# Patient Record
Sex: Female | Born: 1965 | Race: Black or African American | Hispanic: No | Marital: Married | Smoking: Never smoker
Health system: Southern US, Community
[De-identification: ages and names within clinical notes are randomized; demographics above are authoritative.]

## PROBLEM LIST (undated history)

## (undated) DIAGNOSIS — C801 Malignant (primary) neoplasm, unspecified: Secondary | ICD-10-CM

## (undated) DIAGNOSIS — I10 Essential (primary) hypertension: Secondary | ICD-10-CM

## (undated) HISTORY — PX: ABDOMINAL HYSTERECTOMY: SHX81

## (undated) HISTORY — PX: CHOLECYSTECTOMY: SHX55

---

## 2005-07-24 DIAGNOSIS — C801 Malignant (primary) neoplasm, unspecified: Secondary | ICD-10-CM

## 2005-07-24 HISTORY — PX: BREAST LUMPECTOMY: SHX2

## 2005-07-24 HISTORY — DX: Malignant (primary) neoplasm, unspecified: C80.1

## 2006-05-14 ENCOUNTER — Emergency Department (HOSPITAL_COMMUNITY): Admission: EM | Admit: 2006-05-14 | Discharge: 2006-05-14 | Payer: Self-pay | Admitting: Emergency Medicine

## 2006-11-19 ENCOUNTER — Emergency Department (HOSPITAL_COMMUNITY): Admission: EM | Admit: 2006-11-19 | Discharge: 2006-11-20 | Payer: Self-pay | Admitting: Emergency Medicine

## 2007-01-01 ENCOUNTER — Ambulatory Visit: Admission: RE | Admit: 2007-01-01 | Discharge: 2007-03-05 | Payer: Self-pay | Admitting: Radiation Oncology

## 2007-02-15 ENCOUNTER — Ambulatory Visit (HOSPITAL_COMMUNITY): Admission: RE | Admit: 2007-02-15 | Discharge: 2007-02-15 | Payer: Self-pay | Admitting: Obstetrics & Gynecology

## 2010-12-06 NOTE — Op Note (Signed)
Pamela Pitts, Pamela Pitts                ACCOUNT NO.:  [id number]   MEDICAL RECORD NO.:  [id number]          PATIENT TYPE:  AMB   LOCATION:  SDC                           FACILITY:  [facility]   PHYSICIAN:  Roseanna Rainbow, M.D.DATE OF BIRTH:  1966/04/29   DATE OF PROCEDURE:  02/15/2007  DATE OF DISCHARGE:                               OPERATIVE REPORT   PREOPERATIVE DIAGNOSIS:  Multiparity, desires sterilization procedure.   POSTOPERATIVE DIAGNOSIS:  Multiparity, desires sterilization procedure.   PROCEDURE:  Laparoscopic bilateral tubal ligation with fulguration.   SURGEON:  Roseanna Rainbow, M.D.   ANESTHESIA:  General endotracheal anesthesia.   ESTIMATED BLOOD LOSS:  Minimal.   COMPLICATIONS:  None.   DESCRIPTION OF PROCEDURE:  The patient was taken to the operating room  with an IV running.  She was given general anesthesia and placed in the  dorsal lithotomy position.  A bivalve speculum was placed in the  patient's vagina.  The anterior lip of the cervix was grasped with a  single tooth tenaculum.  A Hulka manipulator was then advanced into the  uterus and secured to the anterior lip of the cervix as a means of  manipulating the uterus.  The single tooth tenaculum and speculum were  then removed.  The abdomen and vagina was then prepped and draped in the  usual sterile fashion.  A time out was then performed.   An infraumbilical skin incision was made with the scalpel.  The Veress  needle was then introduced into the abdomen while tenting up the  anterior abdominal wall at a 45 degrees angle.  A saline drop test was  used to confirm intra-abdominal placement.  There was an appropriate low  pressure reading upon initial insufflation of the abdomen with CO2 gas.  The abdomen was then insufflated.  The Veress needle was removed.  The  trocar and sleeve were then advanced into the abdomen where intra-  abdominal placement was confirmed with the laparoscope.  A survey of  the  pelvic anatomy revealed normal pelvic viscera.  The right fallopian tube  was then followed out to the fimbriated end.  A 2 cm segment of the mid  isthmic portion of the tube was then contiguously cauterized using the  Kleppinger instrument.  With each application, the ohmmeter was noted to  go to 0.  The left fallopian tube was manipulated in a similar fashion.  All instruments were removed from the abdomen.  The skin was closed  using interrupted sutures of  3-0 Vicryl and Dermabond.  The Hulka manipulator was removed from the  vagina with minimal bleeding noted from the cervix.  At the close of the  procedure, the instrument and pack counts were said to be correct x2.  The patient was taken to the PACU awake and in stable condition.      Roseanna Rainbow, M.D.  Electronically Signed     LAJ/MEDQ  D:  02/15/2007  T:  02/16/2007  Job:  [id number]

## 2011-05-08 LAB — CBC
HCT: 36.8
Hemoglobin: 12
MCHC: 32.6
RDW: 15.9 — ABNORMAL HIGH

## 2017-11-24 ENCOUNTER — Emergency Department (HOSPITAL_COMMUNITY)

## 2017-11-24 ENCOUNTER — Other Ambulatory Visit: Payer: Self-pay

## 2017-11-24 ENCOUNTER — Encounter (HOSPITAL_COMMUNITY): Payer: Self-pay

## 2017-11-24 ENCOUNTER — Emergency Department (HOSPITAL_COMMUNITY)
Admission: EM | Admit: 2017-11-24 | Discharge: 2017-11-24 | Disposition: A | Discharge status: Home / Self Care | Attending: Emergency Medicine | Admitting: Emergency Medicine

## 2017-11-24 DIAGNOSIS — Y92410 Unspecified street and highway as the place of occurrence of the external cause: Secondary | ICD-10-CM | POA: Diagnosis not present

## 2017-11-24 DIAGNOSIS — I10 Essential (primary) hypertension: Secondary | ICD-10-CM | POA: Diagnosis not present

## 2017-11-24 DIAGNOSIS — Y999 Unspecified external cause status: Secondary | ICD-10-CM | POA: Insufficient documentation

## 2017-11-24 DIAGNOSIS — K649 Unspecified hemorrhoids: Secondary | ICD-10-CM | POA: Diagnosis not present

## 2017-11-24 DIAGNOSIS — S161XXA Strain of muscle, fascia and tendon at neck level, initial encounter: Secondary | ICD-10-CM | POA: Diagnosis not present

## 2017-11-24 DIAGNOSIS — Y9389 Activity, other specified: Secondary | ICD-10-CM | POA: Diagnosis not present

## 2017-11-24 HISTORY — DX: Essential (primary) hypertension: I10

## 2017-11-24 HISTORY — DX: Malignant (primary) neoplasm, unspecified: C80.1

## 2017-11-24 LAB — COMPREHENSIVE METABOLIC PANEL
ALT: 19 U/L (ref 14–54)
AST: 20 U/L (ref 15–41)
Albumin: 4 g/dL (ref 3.5–5.0)
Alkaline Phosphatase: 68 U/L (ref 38–126)
Anion gap: 8 (ref 5–15)
BUN: 8 mg/dL (ref 6–20)
CO2: 28 mmol/L (ref 22–32)
Calcium: 9.1 mg/dL (ref 8.9–10.3)
Chloride: 108 mmol/L (ref 101–111)
Creatinine, Ser: 0.86 mg/dL (ref 0.44–1.00)
Glucose, Bld: 116 mg/dL — ABNORMAL HIGH (ref 65–99)
POTASSIUM: 3.9 mmol/L (ref 3.5–5.1)
SODIUM: 144 mmol/L (ref 135–145)
Total Bilirubin: 0.4 mg/dL (ref 0.3–1.2)
Total Protein: 7.3 g/dL (ref 6.5–8.1)

## 2017-11-24 LAB — CBC
HEMATOCRIT: 35.1 % — AB (ref 36.0–46.0)
HEMOGLOBIN: 11.2 g/dL — AB (ref 12.0–15.0)
MCH: 24.5 pg — AB (ref 26.0–34.0)
MCHC: 31.9 g/dL (ref 30.0–36.0)
MCV: 76.6 fL — AB (ref 78.0–100.0)
Platelets: 332 10*3/uL (ref 150–400)
RBC: 4.58 MIL/uL (ref 3.87–5.11)
RDW: 16.1 % — ABNORMAL HIGH (ref 11.5–15.5)
WBC: 5.5 10*3/uL (ref 4.0–10.5)

## 2017-11-24 LAB — POC OCCULT BLOOD, ED: Fecal Occult Bld: NEGATIVE

## 2017-11-24 MED ORDER — IBUPROFEN 600 MG PO TABS: 600.0000 mg | ORAL_TABLET | Freq: Four times a day (QID) | ORAL | 0 refills | Status: DC | PRN

## 2017-11-24 MED ORDER — CYCLOBENZAPRINE HCL 10 MG PO TABS: 10.0000 mg | ORAL_TABLET | Freq: Two times a day (BID) | ORAL | 0 refills | Status: DC | PRN
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
## 2020-09-12 ENCOUNTER — Other Ambulatory Visit: Payer: Self-pay

## 2020-09-12 ENCOUNTER — Emergency Department (HOSPITAL_COMMUNITY)
Admission: EM | Admit: 2020-09-12 | Discharge: 2020-09-12 | Disposition: A | Discharge status: Home / Self Care | Attending: Emergency Medicine | Admitting: Emergency Medicine

## 2020-09-12 ENCOUNTER — Encounter (HOSPITAL_COMMUNITY): Payer: Self-pay | Admitting: *Deleted

## 2020-09-12 DIAGNOSIS — Z859 Personal history of malignant neoplasm, unspecified: Secondary | ICD-10-CM | POA: Insufficient documentation

## 2020-09-12 DIAGNOSIS — Z79899 Other long term (current) drug therapy: Secondary | ICD-10-CM | POA: Diagnosis not present

## 2020-09-12 DIAGNOSIS — T7840XA Allergy, unspecified, initial encounter: Secondary | ICD-10-CM | POA: Diagnosis not present

## 2020-09-12 DIAGNOSIS — I10 Essential (primary) hypertension: Secondary | ICD-10-CM | POA: Diagnosis not present

## 2020-09-12 DIAGNOSIS — L509 Urticaria, unspecified: Secondary | ICD-10-CM | POA: Diagnosis present

## 2020-09-12 MED ORDER — METHYLPREDNISOLONE SODIUM SUCC 125 MG IJ SOLR
125.0000 mg | Freq: Once | INTRAMUSCULAR | Status: AC
  Administered 2020-09-12: 125 mg via INTRAVENOUS
  Filled 2020-09-12: qty 2

## 2020-09-12 MED ORDER — SODIUM CHLORIDE 0.9 % IV SOLN
INTRAVENOUS | Status: DC
  Administered 2020-09-12: 1000 mL via INTRAVENOUS

## 2020-09-12 MED ORDER — FAMOTIDINE 20 MG PO TABS: 20.0000 mg | ORAL_TABLET | Freq: Two times a day (BID) | ORAL | 0 refills | Status: DC

## 2020-09-12 MED ORDER — DIPHENHYDRAMINE HCL 25 MG PO TABS: 25.0000 mg | ORAL_TABLET | Freq: Four times a day (QID) | ORAL | 0 refills | Status: DC

## 2020-09-12 MED ORDER — PREDNISONE 10 MG PO TABS: 40.0000 mg | ORAL_TABLET | Freq: Every day | ORAL | 0 refills | Status: DC

## 2020-09-12 MED ORDER — FAMOTIDINE IN NACL 20-0.9 MG/50ML-% IV SOLN
20.0000 mg | Freq: Once | INTRAVENOUS | Status: AC
  Administered 2020-09-12: 20 mg via INTRAVENOUS
  Filled 2020-09-12: qty 50

## 2020-09-12 NOTE — ED Triage Notes (Signed)
Pt began to have facial redness to face with hives and tongue swelling after eating shrimp at 1645, took two 25 mg benadryl at 1700.  Still c/o tongue swelling. Able to swallow and breathe okay.

## 2020-09-12 NOTE — ED Provider Notes (Signed)
[facility] EMERGENCY DEPARTMENT Provider Note   CSN: [id number] Arrival date & time: 09/12/20  1733     History Chief Complaint  Patient presents with  . Allergic Reaction    Pamela Pitts is a 55 y.o. female.  Patient developed hives on her face facial redness some tongue swelling and may be some swelling to her lower lip.  Felt a little strange with swallowing.  But her breathing was okay.  Occurred after eating shrimp at about 1645.  Patient took 50 mg of Benadryl at 1700.  Patient was here by 1800.  Oxygen saturation 97%.  With the Benadryl the rash was resolving but the tongue swelling and lip swelling was still present.  Patient did not stated there was no rash anywhere other than her face.  No history of any allergy problems in the past.        Past Medical History:  Diagnosis Date  . Cancer ([place])   . Hypertension     There are no problems to display for this patient.   Past Surgical History:  Procedure Laterality Date  . ABDOMINAL HYSTERECTOMY       OB History   No obstetric history on file.     History reviewed. No pertinent family history.  Social History   Tobacco Use  . Smoking status: Never Smoker  . Smokeless tobacco: Never Used  Substance Use Topics  . Alcohol use: Not Currently  . Drug use: Never    Home Medications Prior to Admission medications   Medication Sig Start Date End Date Taking? Authorizing Provider  acetaminophen (TYLENOL) 325 MG tablet Take 650 mg by mouth every 6 (six) hours as needed for headache (pain).   Yes [provider]  diphenhydrAMINE (BENADRYL) 25 mg capsule Take 50 mg by mouth every 6 (six) hours as needed. 09/26/18  Yes [provider]  diphenhydrAMINE (BENADRYL) 25 MG tablet Take 1 tablet (25 mg total) by mouth every 6 (six) hours. 09/12/20  Yes Fredia Sorrow, MD  famotidine (PEPCID) 20 MG tablet Take 1 tablet (20 mg total) by mouth 2 (two) times daily. 09/12/20  Yes Fredia Sorrow, MD   lisinopril (PRINIVIL,ZESTRIL) 5 MG tablet Take 5 mg by mouth at bedtime.   Yes [provider]  metFORMIN (GLUCOPHAGE) 500 MG tablet Take 500 mg by mouth daily with breakfast. 08/26/20 11/24/20 Yes [provider]  omeprazole (PRILOSEC) 40 MG capsule Take 40 mg by mouth daily. 09/02/20  Yes [provider]  predniSONE (DELTASONE) 10 MG tablet Take 4 tablets (40 mg total) by mouth daily. 09/12/20  Yes Fredia Sorrow, MD  venlafaxine XR (EFFEXOR-XR) 75 MG 24 hr capsule Take 75 mg by mouth at bedtime.   Yes [provider]  cyclobenzaprine (FLEXERIL) 10 MG tablet Take 1 tablet (10 mg total) by mouth 2 (two) times daily as needed for muscle spasms. Patient not taking: No sig reported 11/24/17   Domenic Moras, PA-C  diazePAM (VALIUM PO) Take 1 tablet by mouth at bedtime as needed (sleep). Patient not taking: Reported on 09/12/2020    [provider]  ibuprofen (ADVIL,MOTRIN) 600 MG tablet Take 1 tablet (600 mg total) by mouth every 6 (six) hours as needed for moderate pain (pain). Patient not taking: No sig reported 11/24/17   Domenic Moras, PA-C    Allergies    Shrimp (diagnostic)  Review of Systems   Review of Systems  Constitutional: Negative for chills and fever.  HENT: Positive for facial swelling and trouble  swallowing. Negative for rhinorrhea, sore throat and voice change.   Eyes: Negative for visual disturbance.  Respiratory: Negative for cough and shortness of breath.   Cardiovascular: Negative for chest pain and leg swelling.  Gastrointestinal: Negative for abdominal pain, diarrhea, nausea and vomiting.  Genitourinary: Negative for dysuria.  Musculoskeletal: Negative for back pain and neck pain.  Skin: Positive for rash.  Neurological: Negative for dizziness, light-headedness and headaches.  Hematological: Does not bruise/bleed easily.  Psychiatric/Behavioral: Negative for confusion.    Physical Exam Updated Vital Signs BP (!) 149/99   Pulse  92   Temp 98.2 F (36.8 C) (Oral)   Resp (!) 21   Ht 1.575 m (5\' 2" )   Wt 86.6 kg   SpO2 95%   BMI 34.93 kg/m   Physical Exam Vitals and nursing note reviewed.  Constitutional:      General: She is not in acute distress.    Appearance: She is well-developed and well-nourished.  HENT:     Head: Normocephalic and atraumatic.     Mouth/Throat:     Mouth: Mucous membranes are moist.     Comments: Slight thickening of the tongue.  Slight swelling to the lower lip on the right side Eyes:     Extraocular Movements: Extraocular movements intact.     Conjunctiva/sclera: Conjunctivae normal.     Pupils: Pupils are equal, round, and reactive to light.  Cardiovascular:     Rate and Rhythm: Normal rate and regular rhythm.     Heart sounds: No murmur heard.   Pulmonary:     Effort: Pulmonary effort is normal. No respiratory distress.     Breath sounds: Normal breath sounds.  Abdominal:     Palpations: Abdomen is soft.     Tenderness: There is no abdominal tenderness.  Musculoskeletal:        General: No edema.     Cervical back: Normal range of motion and neck supple.  Skin:    General: Skin is warm and dry.  Neurological:     General: No focal deficit present.     Mental Status: She is alert and oriented to person, place, and time.     Cranial Nerves: No cranial nerve deficit.     Sensory: No sensory deficit.  Psychiatric:        Mood and Affect: Mood and affect normal.     ED Results / Procedures / Treatments   Labs (all labs ordered are listed, but only abnormal results are displayed) Labs Reviewed - No data to display  EKG None  Radiology No results found.  Procedures Procedures   CRITICAL CARE Performed by: Fredia Sorrow Total critical care time: 45 minutes Critical care time was exclusive of separately billable procedures and treating other patients. Critical care was necessary to treat or prevent imminent or life-threatening deterioration. Critical care  was time spent personally by me on the following activities: development of treatment plan with patient and/or surrogate as well as nursing, discussions with consultants, evaluation of patient's response to treatment, examination of patient, obtaining history from patient or surrogate, ordering and performing treatments and interventions, ordering and review of laboratory studies, ordering and review of radiographic studies, pulse oximetry and re-evaluation of patient's condition.    Medications Ordered in ED Medications  0.9 %  sodium chloride infusion (1,000 mLs Intravenous New Bag/Given 09/12/20 1901)  methylPREDNISolone sodium succinate (SOLU-MEDROL) 125 mg/2 mL injection 125 mg (125 mg Intravenous Given 09/12/20 1902)  famotidine (PEPCID) IVPB 20 mg premix (0  mg Intravenous Stopped 09/12/20 1945)    ED Course  I have reviewed the triage vital signs and the nursing notes.  Pertinent labs & imaging results that were available during my care of the patient were reviewed by me and considered in my medical decision making (see chart for details).    MDM Rules/Calculators/A&P                          Patient still with tongue swelling and some lip swelling.  Rash improved with the Benadryl she took at home.  Patient given IV Solu-Medrol and IV Pepcid.  And observed.  Patient states tongue is now back to normal.  Still some slight lower lip swelling.  No wheezing no voice change.  No difficulty swallowing.  No other rash.  Patient stable for discharge home.  We will continue on Benadryl every 6 hours at least for the next 24 hours and then Pepcid for 7 days and prednisone for 5 days.  Patient will avoid any fish or shellfish.  Patient hemodynamically stable for discharge home. Final Clinical Impression(s) / ED Diagnoses Final diagnoses:  Allergic reaction, initial encounter    Rx / DC Orders ED Discharge Orders         Ordered    diphenhydrAMINE (BENADRYL) 25 MG tablet  Every 6 hours         09/12/20 2307    famotidine (PEPCID) 20 MG tablet  2 times daily        09/12/20 2307    predniSONE (DELTASONE) 10 MG tablet  Daily        09/12/20 2307           Fredia Sorrow, MD 09/12/20 2312

## 2020-09-12 NOTE — Discharge Instructions (Signed)
Take Benadryl 25 mg every 6 hours for the next 24 hours.  Take the Pepcid as prescribed for the next 7 days.  And take prednisone as prescribed for the next 5 days.  Avoid any seafood or shellfish.  Return for any new or worse symptoms

## 2021-03-28 ENCOUNTER — Emergency Department (HOSPITAL_COMMUNITY)

## 2021-03-28 ENCOUNTER — Encounter (HOSPITAL_COMMUNITY): Payer: Self-pay

## 2021-03-28 ENCOUNTER — Other Ambulatory Visit: Payer: Self-pay

## 2021-03-28 ENCOUNTER — Emergency Department (HOSPITAL_COMMUNITY)
Admission: EM | Admit: 2021-03-28 | Discharge: 2021-03-28 | Disposition: A | Discharge status: Home / Self Care | Attending: Emergency Medicine | Admitting: Emergency Medicine

## 2021-03-28 DIAGNOSIS — I10 Essential (primary) hypertension: Secondary | ICD-10-CM | POA: Diagnosis not present

## 2021-03-28 DIAGNOSIS — Z853 Personal history of malignant neoplasm of breast: Secondary | ICD-10-CM | POA: Diagnosis not present

## 2021-03-28 DIAGNOSIS — Z79899 Other long term (current) drug therapy: Secondary | ICD-10-CM | POA: Insufficient documentation

## 2021-03-28 DIAGNOSIS — R6884 Jaw pain: Secondary | ICD-10-CM

## 2021-03-28 LAB — CBC WITH DIFFERENTIAL/PLATELET
Abs Immature Granulocytes: 0.02 10*3/uL (ref 0.00–0.07)
Basophils Absolute: 0 10*3/uL (ref 0.0–0.1)
Basophils Relative: 1 %
Eosinophils Absolute: 0.1 10*3/uL (ref 0.0–0.5)
Eosinophils Relative: 3 %
HCT: 35.6 % — ABNORMAL LOW (ref 36.0–46.0)
Hemoglobin: 11.5 g/dL — ABNORMAL LOW (ref 12.0–15.0)
Immature Granulocytes: 0 %
Lymphocytes Relative: 18 %
Lymphs Abs: 1 10*3/uL (ref 0.7–4.0)
MCH: 26 pg (ref 26.0–34.0)
MCHC: 32.3 g/dL (ref 30.0–36.0)
MCV: 80.5 fL (ref 80.0–100.0)
Monocytes Absolute: 0.4 10*3/uL (ref 0.1–1.0)
Monocytes Relative: 7 %
Neutro Abs: 3.8 10*3/uL (ref 1.7–7.7)
Neutrophils Relative %: 71 %
Platelets: 346 10*3/uL (ref 150–400)
RBC: 4.42 MIL/uL (ref 3.87–5.11)
RDW: 15.4 % (ref 11.5–15.5)
WBC: 5.3 10*3/uL (ref 4.0–10.5)
nRBC: 0 % (ref 0.0–0.2)

## 2021-03-28 LAB — BASIC METABOLIC PANEL
Anion gap: 6 (ref 5–15)
BUN: 11 mg/dL (ref 6–20)
CO2: 25 mmol/L (ref 22–32)
Calcium: 8.8 mg/dL — ABNORMAL LOW (ref 8.9–10.3)
Chloride: 107 mmol/L (ref 98–111)
Creatinine, Ser: 0.85 mg/dL (ref 0.44–1.00)
GFR, Estimated: >60 mL/min (ref >60)
Glucose, Bld: 116 mg/dL — ABNORMAL HIGH (ref 70–99)
Potassium: 4.2 mmol/L (ref 3.5–5.1)
Sodium: 138 mmol/L (ref 135–145)

## 2021-03-28 MED ORDER — IOHEXOL 350 MG/ML SOLN
75.0000 mL | Freq: Once | INTRAVENOUS | Status: AC | PRN
  Administered 2021-03-28: 75 mL via INTRAVENOUS

## 2021-03-28 MED ORDER — AMOXICILLIN 500 MG PO CAPS: 500.0000 mg | ORAL_CAPSULE | Freq: Three times a day (TID) | ORAL | 0 refills | Status: AC

## 2021-03-28 MED ORDER — AMOXICILLIN 250 MG PO CAPS
500.0000 mg | ORAL_CAPSULE | Freq: Once | ORAL | Status: AC
  Administered 2021-03-28: 500 mg via ORAL
  Filled 2021-03-28: qty 2

## 2021-03-28 MED ORDER — KETOROLAC TROMETHAMINE 30 MG/ML IJ SOLN
30.0000 mg | Freq: Once | INTRAMUSCULAR | Status: AC
  Administered 2021-03-28: 30 mg via INTRAVENOUS
  Filled 2021-03-28: qty 1

## 2021-03-28 MED ORDER — HYDROCODONE-ACETAMINOPHEN 5-325 MG PO TABS: 1.0000 | ORAL_TABLET | ORAL | 0 refills | Status: AC | PRN

## 2021-03-28 NOTE — ED Notes (Signed)
Discharge instructions including prescription and pain management discussed with pt. Pt verbalized understanding with no questions at this time. Pt also provided with 6 tb Vicodin per order. Signature obtain verify receipt of medication placed at pysis.
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
## 2021-03-28 NOTE — ED Provider Notes (Signed)
[facility] EMERGENCY DEPARTMENT Provider Note   CSN: [id number] Arrival date & time: 03/28/21  0434     History Chief Complaint  Patient presents with   Jaw Pain    Pamela Pitts is a 55 y.o. female.  Presents to the emergency department for evaluation of jaw pain and swelling.  Patient reports that symptoms began 2 days ago.  She reports that any movement of her jaw causes increased pain.  She has been taking multiple OTC painkillers without any improvement of her pain.  She has not noticed any intraoral swelling or tooth ache.      Past Medical History:  Diagnosis Date   Cancer [facility]) 2007   R Breast   Hypertension     There are no problems to display for this patient.   Past Surgical History:  Procedure Laterality Date   ABDOMINAL HYSTERECTOMY     BREAST LUMPECTOMY Right 2007   CHOLECYSTECTOMY       OB History   No obstetric history on file.     History reviewed. No pertinent family history.  Social History   Tobacco Use   Smoking status: Never   Smokeless tobacco: Never  Substance Use Topics   Alcohol use: Not Currently   Drug use: Never    Home Medications Prior to Admission medications   Medication Sig Start Date End Date Taking? Authorizing Provider  amoxicillin (AMOXIL) 500 MG capsule Take 1 capsule (500 mg total) by mouth 3 (three) times daily. 03/28/21  Yes Serayah Yazdani, Gwenyth Allegra, MD  HYDROcodone-acetaminophen (NORCO/VICODIN) 5-325 MG tablet Take 1-2 tablets by mouth every 4 (four) hours as needed. 03/28/21  Yes Sherryl Valido, Gwenyth Allegra, MD  acetaminophen (TYLENOL) 325 MG tablet Take 650 mg by mouth every 6 (six) hours as needed for headache (pain).    [provider]  diazePAM (VALIUM PO) Take 1 tablet by mouth at bedtime as needed (sleep). Patient not taking: Reported on 09/12/2020    [provider]  lisinopril (PRINIVIL,ZESTRIL) 5 MG tablet Take 5 mg by mouth at bedtime.    [provider]  metFORMIN (GLUCOPHAGE) 500 MG  tablet Take 500 mg by mouth daily with breakfast. 08/26/20 11/24/20  [provider]  omeprazole (PRILOSEC) 40 MG capsule Take 40 mg by mouth daily. 09/02/20   [provider]  venlafaxine XR (EFFEXOR-XR) 75 MG 24 hr capsule Take 75 mg by mouth at bedtime.    [provider]    Allergies    Shrimp (diagnostic)  Review of Systems   Review of Systems  HENT:  Positive for facial swelling.   All other systems reviewed and are negative.  Physical Exam Updated Vital Signs BP (!) 157/95   Pulse 72   Temp 98.4 F (36.9 C)   Resp 14   Ht '5\' 2"'$  (1.575 m)   Wt 89.4 kg   SpO2 100%   BMI 36.03 kg/m   Physical Exam Vitals and nursing note reviewed.  Constitutional:      General: She is not in acute distress.    Appearance: Normal appearance. She is well-developed.  HENT:     Head: Normocephalic and atraumatic.     Jaw: Tenderness and pain on movement present. No malocclusion.      Right Ear: Hearing normal.     Left Ear: Hearing normal.     Nose: Nose normal.  Eyes:     Conjunctiva/sclera: Conjunctivae normal.     Pupils: Pupils are equal, round, and reactive to light.  Neck:     Comments: Tender nodule upper neck at angle of madible Cardiovascular:     Rate and Rhythm: Regular rhythm.     Heart sounds: S1 normal and S2 normal. No murmur heard.   No friction rub. No gallop.  Pulmonary:     Effort: Pulmonary effort is normal. No respiratory distress.     Breath sounds: Normal breath sounds.  Chest:     Chest wall: No tenderness.  Abdominal:     General: Bowel sounds are normal.     Palpations: Abdomen is soft.     Tenderness: There is no abdominal tenderness. There is no guarding or rebound. Negative signs include Murphy's sign and McBurney's sign.     Hernia: No hernia is present.  Musculoskeletal:        General: Normal range of motion.     Cervical back: Normal range of motion and neck supple.  Skin:    General: Skin is warm and dry.      Findings: No rash.  Neurological:     Mental Status: She is alert and oriented to person, place, and time.     GCS: GCS eye subscore is 4. GCS verbal subscore is 5. GCS motor subscore is 6.     Cranial Nerves: No cranial nerve deficit.     Sensory: No sensory deficit.     Coordination: Coordination normal.  Psychiatric:        Speech: Speech normal.        Behavior: Behavior normal.        Thought Content: Thought content normal.    ED Results / Procedures / Treatments   Labs (all labs ordered are listed, but only abnormal results are displayed) Labs Reviewed  CBC WITH DIFFERENTIAL/PLATELET - Abnormal; Notable for the following components:      Result Value   Hemoglobin 11.5 (*)    HCT 35.6 (*)    All other components within normal limits  BASIC METABOLIC PANEL - Abnormal; Notable for the following components:   Glucose, Bld 116 (*)    Calcium 8.8 (*)    All other components within normal limits    EKG None  Radiology CT Soft Tissue Neck W Contrast  Result Date: 03/28/2021 CLINICAL DATA:  Right jaw pain since Friday.  No toothache EXAM: CT NECK WITH CONTRAST TECHNIQUE: Multidetector CT imaging of the neck was performed using the standard protocol following the bolus administration of intravenous contrast. CONTRAST:  83m OMNIPAQUE IOHEXOL 350 MG/ML SOLN COMPARISON:  None. FINDINGS: Pharynx and larynx: Normal. No mass or swelling. Salivary glands: No inflammation, mass, or stone. Thyroid: No worrisome finding Lymph nodes: None enlarged or abnormal density. Vascular: Unremarkable Limited intracranial: Negative. Visualized orbits: Negative Mastoids and visualized paranasal sinuses: Clear Skeleton: No acute or aggressive process. Torus mandibularis. No visible tooth devitalization. Upper chest: Negative IMPRESSION: No visible inflammation or cause of symptoms. Electronically Signed   By: JMonte FantasiaM.D.   On: 03/28/2021 06:14    Procedures Procedures   Medications Ordered in  ED Medications  amoxicillin (AMOXIL) capsule 500 mg (has no administration in time range)  ketorolac (TORADOL) 30 MG/ML injection 30 mg (30 mg Intravenous Given 03/28/21 0523)  iohexol (OMNIPAQUE) 350 MG/ML injection 75 mL (75 mLs Intravenous Contrast Given 03/28/21 0555)    ED Course  I have reviewed the triage vital signs and the nursing notes.  Pertinent labs & imaging results that were available during my care of the patient were reviewed by me  and considered in my medical decision making (see chart for details).    MDM Rules/Calculators/A&P                           Patient presents to the emergency department for evaluation of right-sided jaw pain.  Patient does have a fullness at the angle of the jaw in the soft tissues of the neck.  No overlying erythema or skin changes.  Oral exam does not reveal any dental abnormalities.  Tympanic membranes are normal.  Oropharyngeal examination is normal.  Discussed possibility of treatment with antibiotics and follow-up, patient concerned she might have cancer.  She has a history of breast cancer.  CT neck performed.  No abnormality is noted on the CT.  No lymphadenopathy, no enlarged salivary glands or salivary stones.  Final Clinical Impression(s) / ED Diagnoses Final diagnoses:  Jaw pain, non-TMJ    Rx / DC Orders ED Discharge Orders          Ordered    amoxicillin (AMOXIL) 500 MG capsule  3 times daily        03/28/21 0623    HYDROcodone-acetaminophen (NORCO/VICODIN) 5-325 MG tablet  Every 4 hours PRN        03/28/21 0624             Orpah Greek, MD 03/28/21 [phone]

## 2021-03-28 NOTE — ED Notes (Signed)
Patient transported to CT 

## 2021-03-29 MED FILL — Hydrocodone-Acetaminophen Tab 5-325 MG: ORAL | Qty: 6 | Status: AC

## 2021-09-24 IMAGING — CT CT NECK W/ CM
3 series · 16 of 33 positions shown, 19 images · IV contrast (Omnipaque or Isovue)
Comparison: None.

CLINICAL DATA: Right jaw pain since [REDACTED].  No toothache

EXAM:
CT NECK WITH CONTRAST
TECHNIQUE: Multidetector CT imaging of the neck was performed using the
standard protocol following the bolus administration of intravenous
contrast.
CONTRAST:  75mL OMNIPAQUE IOHEXOL 350 MG/ML SOLN

[Series 2: axial neck · axial · 0.48mm/px · z∈[-120,+56]mm · 8 of 105 slices shown, 10 images]
[im 9/105  soft-tissue]
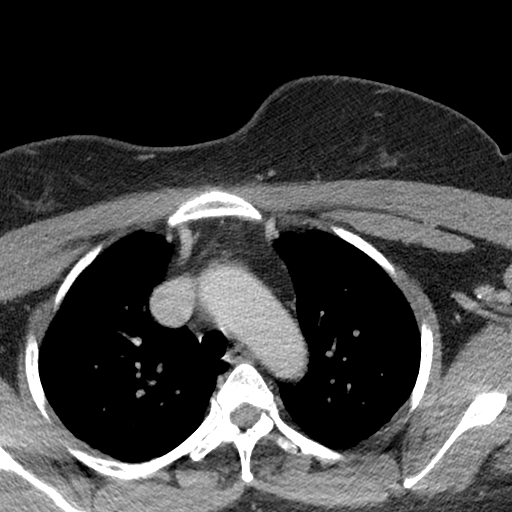
[im 9/105  bone]
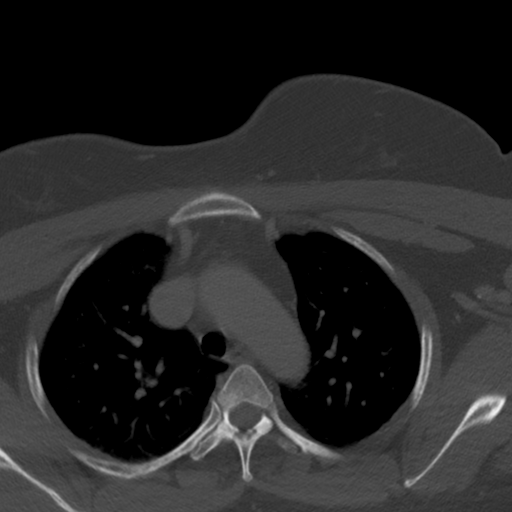
[im 25/105  bone]
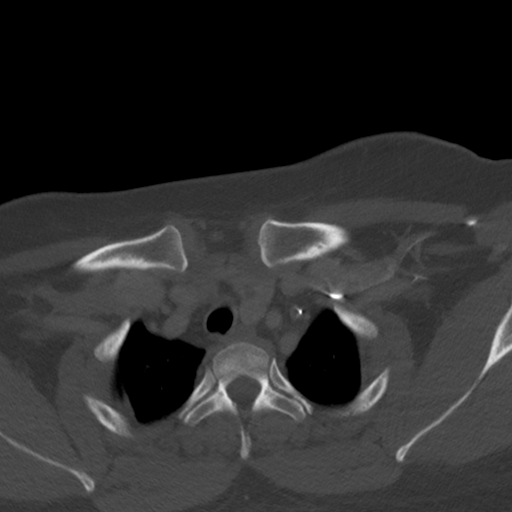
[im 33/105  bone]
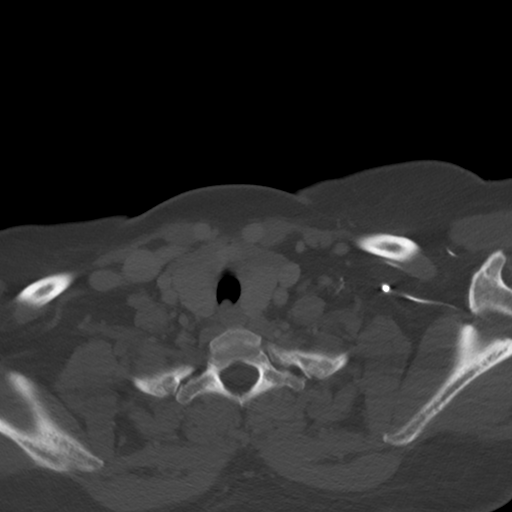
[im 49/105  bone]
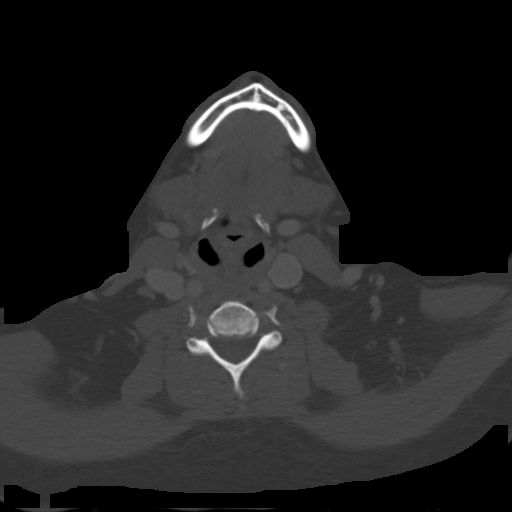
[im 57/105  soft-tissue]
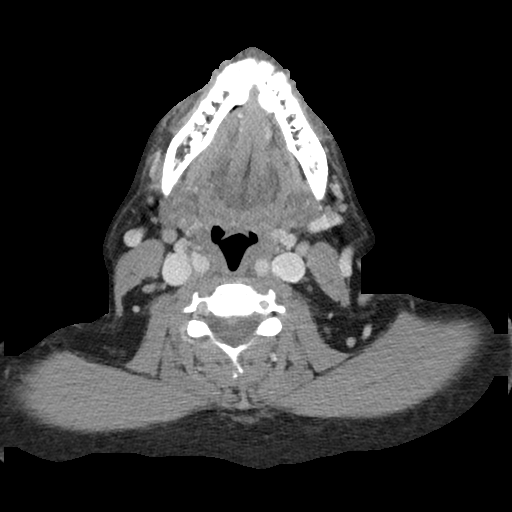
[im 57/105  bone]
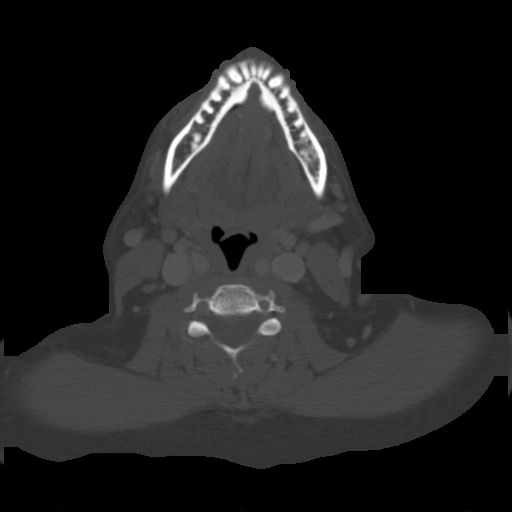
[im 73/105  bone]
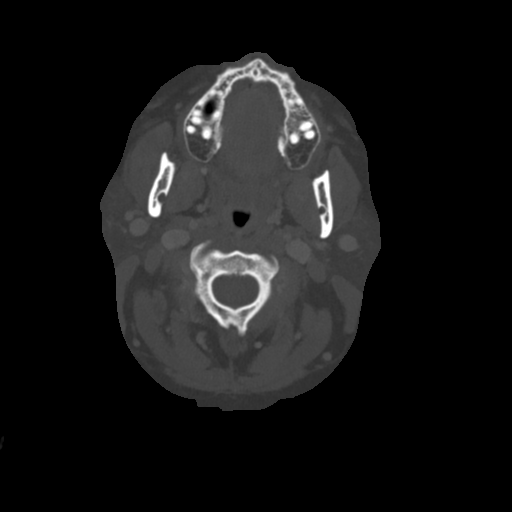
[im 81/105  bone]
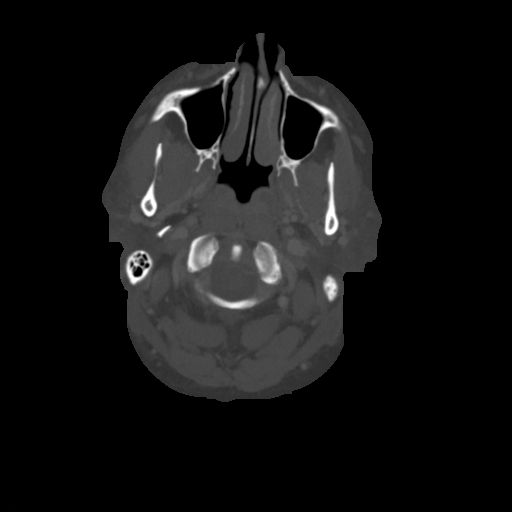
[im 97/105  bone]
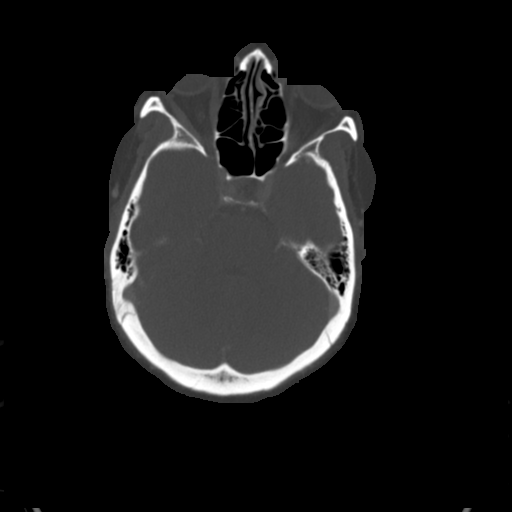

[Series 4: cor neck · coronal · 0.41mm/px · 3 of 101 slices shown]
[im 21/101  bone]
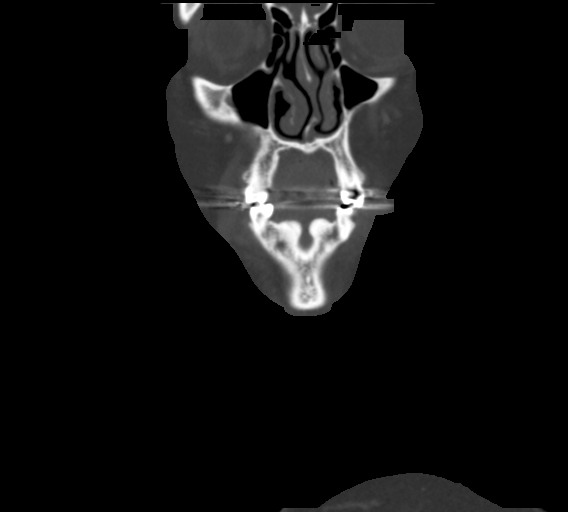
[im 41/101  bone]
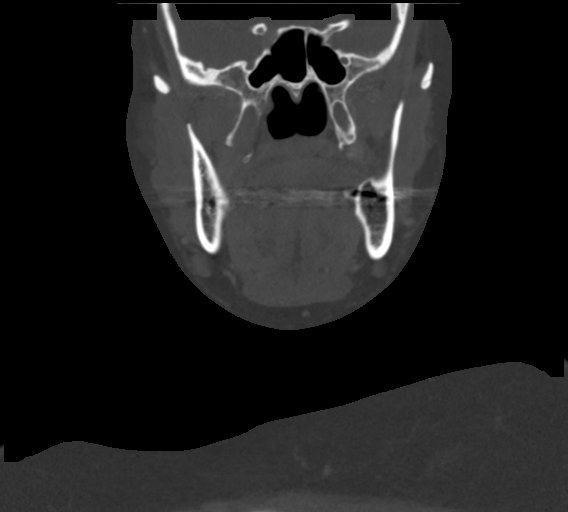
[im 61/101  bone]
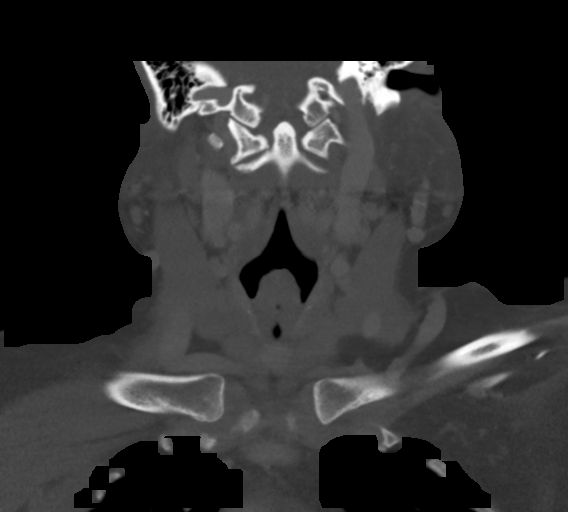

[Series 5: sag neck · sagittal · 0.41mm/px · 5 of 101 slices shown, 6 images]
[im 34/101  bone]
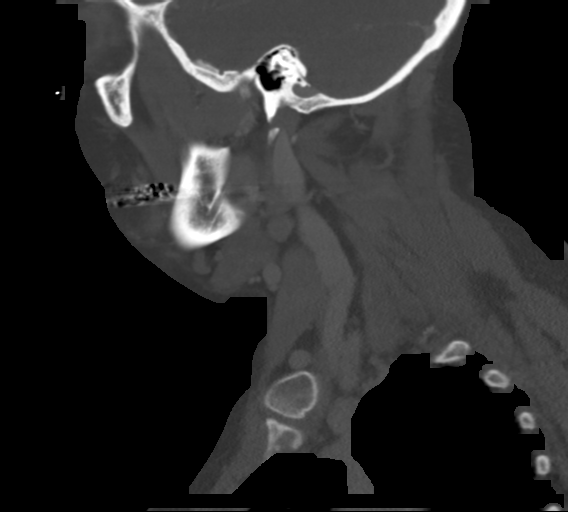
[im 42/101  bone]
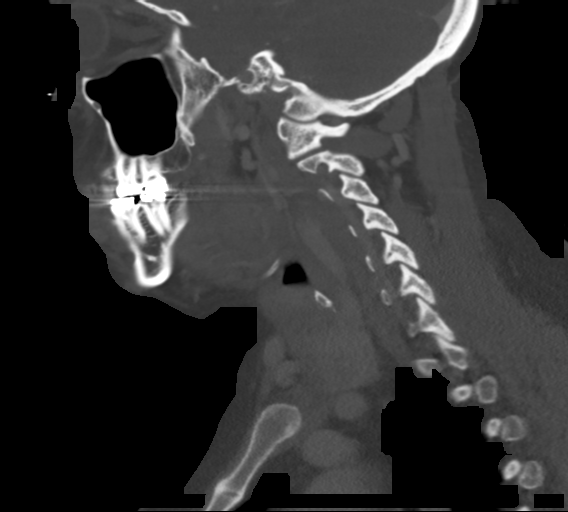
[im 51/101  soft-tissue]
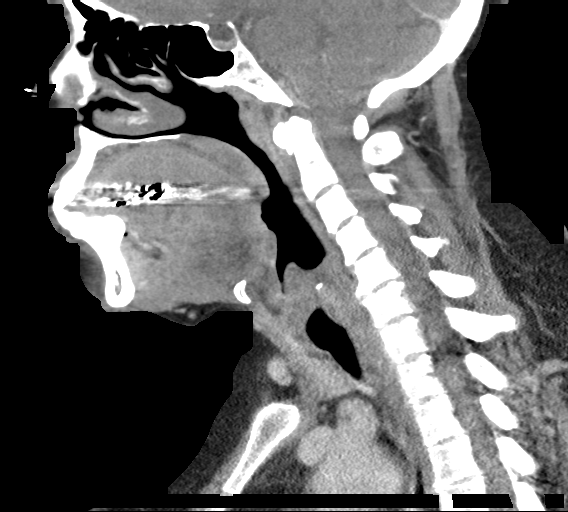
[im 51/101  bone]
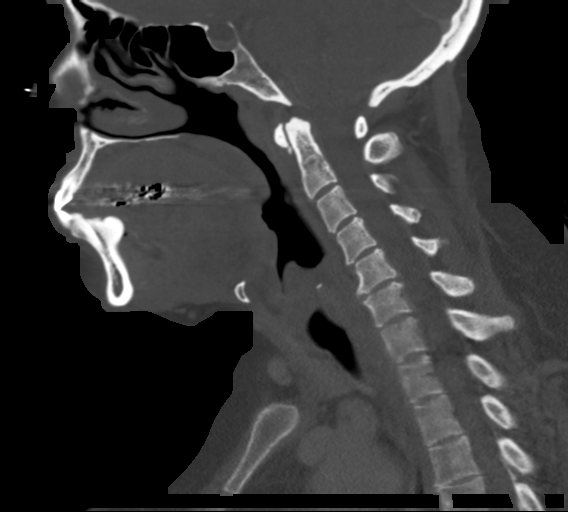
[im 59/101  bone]
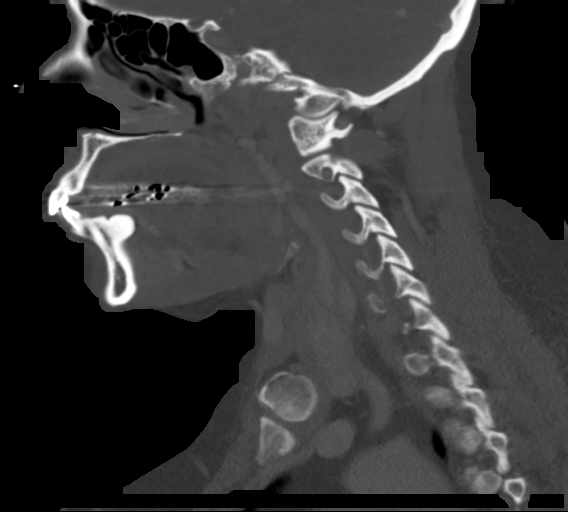
[im 67/101  bone]
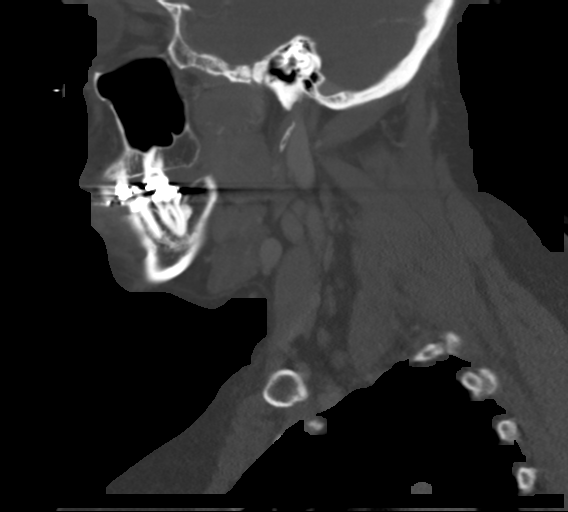

[16 of 33 positions shown; findings below may reference images not displayed]

FINDINGS: Pharynx and larynx: Normal. No mass or swelling.

Salivary glands: No inflammation, mass, or stone.

Thyroid: No worrisome finding

Lymph nodes: None enlarged or abnormal density.

Vascular: Unremarkable

Limited intracranial: Negative.

Visualized orbits: Negative

Mastoids and visualized paranasal sinuses: Clear

Skeleton: No acute or aggressive process. Torus mandibularis. No
visible tooth devitalization.

Upper chest: Negative
IMPRESSION: No visible inflammation or cause of symptoms.
